# Patient Record
Sex: Male | Born: 1985 | Race: Asian | Hispanic: No | Smoking: Never smoker
Health system: Southern US, Community
[De-identification: ages and names within clinical notes are randomized; demographics above are authoritative.]

## PROBLEM LIST (undated history)

## (undated) HISTORY — PX: HERNIA REPAIR: SHX51

---

## 2016-12-11 DIAGNOSIS — Z0000 Encounter for general adult medical examination without abnormal findings: Secondary | ICD-10-CM | POA: Diagnosis not present

## 2016-12-18 DIAGNOSIS — Z0000 Encounter for general adult medical examination without abnormal findings: Secondary | ICD-10-CM | POA: Diagnosis not present

## 2017-07-31 DIAGNOSIS — R293 Abnormal posture: Secondary | ICD-10-CM | POA: Diagnosis not present

## 2017-07-31 DIAGNOSIS — M2560 Stiffness of unspecified joint, not elsewhere classified: Secondary | ICD-10-CM | POA: Diagnosis not present

## 2017-07-31 DIAGNOSIS — M545 Low back pain: Secondary | ICD-10-CM | POA: Diagnosis not present

## 2017-08-04 DIAGNOSIS — M545 Low back pain: Secondary | ICD-10-CM | POA: Diagnosis not present

## 2017-08-04 DIAGNOSIS — M2560 Stiffness of unspecified joint, not elsewhere classified: Secondary | ICD-10-CM | POA: Diagnosis not present

## 2017-08-04 DIAGNOSIS — R293 Abnormal posture: Secondary | ICD-10-CM | POA: Diagnosis not present

## 2017-08-11 DIAGNOSIS — R293 Abnormal posture: Secondary | ICD-10-CM | POA: Diagnosis not present

## 2017-08-11 DIAGNOSIS — M2560 Stiffness of unspecified joint, not elsewhere classified: Secondary | ICD-10-CM | POA: Diagnosis not present

## 2017-08-11 DIAGNOSIS — M545 Low back pain: Secondary | ICD-10-CM | POA: Diagnosis not present

## 2017-08-13 DIAGNOSIS — R293 Abnormal posture: Secondary | ICD-10-CM | POA: Diagnosis not present

## 2017-08-13 DIAGNOSIS — M545 Low back pain: Secondary | ICD-10-CM | POA: Diagnosis not present

## 2017-08-13 DIAGNOSIS — M2560 Stiffness of unspecified joint, not elsewhere classified: Secondary | ICD-10-CM | POA: Diagnosis not present

## 2017-08-14 DIAGNOSIS — M2560 Stiffness of unspecified joint, not elsewhere classified: Secondary | ICD-10-CM | POA: Diagnosis not present

## 2017-08-14 DIAGNOSIS — R293 Abnormal posture: Secondary | ICD-10-CM | POA: Diagnosis not present

## 2017-08-14 DIAGNOSIS — M545 Low back pain: Secondary | ICD-10-CM | POA: Diagnosis not present

## 2017-08-18 DIAGNOSIS — R293 Abnormal posture: Secondary | ICD-10-CM | POA: Diagnosis not present

## 2017-08-18 DIAGNOSIS — M2560 Stiffness of unspecified joint, not elsewhere classified: Secondary | ICD-10-CM | POA: Diagnosis not present

## 2017-08-18 DIAGNOSIS — M545 Low back pain: Secondary | ICD-10-CM | POA: Diagnosis not present

## 2017-08-20 DIAGNOSIS — M545 Low back pain: Secondary | ICD-10-CM | POA: Diagnosis not present

## 2017-08-20 DIAGNOSIS — R293 Abnormal posture: Secondary | ICD-10-CM | POA: Diagnosis not present

## 2017-08-20 DIAGNOSIS — M2560 Stiffness of unspecified joint, not elsewhere classified: Secondary | ICD-10-CM | POA: Diagnosis not present

## 2017-11-05 DIAGNOSIS — H6692 Otitis media, unspecified, left ear: Secondary | ICD-10-CM | POA: Diagnosis not present

## 2017-12-24 DIAGNOSIS — Z0000 Encounter for general adult medical examination without abnormal findings: Secondary | ICD-10-CM | POA: Diagnosis not present

## 2017-12-24 DIAGNOSIS — F17200 Nicotine dependence, unspecified, uncomplicated: Secondary | ICD-10-CM | POA: Diagnosis not present

## 2017-12-24 DIAGNOSIS — E669 Obesity, unspecified: Secondary | ICD-10-CM | POA: Diagnosis not present

## 2017-12-31 DIAGNOSIS — Z0000 Encounter for general adult medical examination without abnormal findings: Secondary | ICD-10-CM | POA: Diagnosis not present

## 2018-03-24 ENCOUNTER — Other Ambulatory Visit: Payer: Self-pay

## 2018-03-24 ENCOUNTER — Other Ambulatory Visit: Payer: Self-pay | Admitting: Internal Medicine

## 2018-03-24 DIAGNOSIS — M79661 Pain in right lower leg: Secondary | ICD-10-CM

## 2018-03-24 DIAGNOSIS — M7989 Other specified soft tissue disorders: Principal | ICD-10-CM

## 2018-03-25 ENCOUNTER — Encounter (HOSPITAL_COMMUNITY): Payer: Self-pay

## 2018-03-25 ENCOUNTER — Other Ambulatory Visit: Payer: Self-pay

## 2018-03-25 ENCOUNTER — Emergency Department (HOSPITAL_COMMUNITY): Payer: Managed Care, Other (non HMO)

## 2018-03-25 ENCOUNTER — Emergency Department (HOSPITAL_COMMUNITY)
Admission: EM | Admit: 2018-03-25 | Discharge: 2018-03-25 | Disposition: A | Discharge status: Home / Self Care | Payer: Managed Care, Other (non HMO) | Attending: Emergency Medicine | Admitting: Emergency Medicine

## 2018-03-25 DIAGNOSIS — Y939 Activity, unspecified: Secondary | ICD-10-CM | POA: Diagnosis not present

## 2018-03-25 DIAGNOSIS — Y999 Unspecified external cause status: Secondary | ICD-10-CM | POA: Diagnosis not present

## 2018-03-25 DIAGNOSIS — Y929 Unspecified place or not applicable: Secondary | ICD-10-CM | POA: Insufficient documentation

## 2018-03-25 DIAGNOSIS — S93491A Sprain of other ligament of right ankle, initial encounter: Secondary | ICD-10-CM | POA: Insufficient documentation

## 2018-03-25 DIAGNOSIS — S99911A Unspecified injury of right ankle, initial encounter: Secondary | ICD-10-CM | POA: Diagnosis present

## 2018-03-25 DIAGNOSIS — X58XXXA Exposure to other specified factors, initial encounter: Secondary | ICD-10-CM | POA: Diagnosis not present

## 2018-03-25 MED ORDER — LIDOCAINE-EPINEPHRINE (PF) 2 %-1:200000 IJ SOLN
20.0000 mL | Freq: Once | INTRAMUSCULAR | Status: AC
  Administered 2018-03-25: 20 mL
  Filled 2018-03-25: qty 20

## 2018-03-25 NOTE — ED Provider Notes (Signed)
[facility] EMERGENCY DEPARTMENT Provider Note   CSN: [id number] Arrival date & time: 03/25/18  1651    History   Chief Complaint Chief Complaint  Patient presents with  . Ankle Pain    HPI Calvin Hudson is a 33 y.o. male.     33 yo M with a chief complaint of right ankle pain.  Patient denies any injury.  Been going on for the past week.  He denies fevers or chills denies redness in the area.  He has been trying to not bear weight on it has had significant worsening edema and pain.  The history is provided by the patient.  Ankle Pain  Location:  Ankle Time since incident:  1 week Injury: no   Ankle location:  L ankle and R ankle Pain details:    Quality:  Aching   Radiates to:  Does not radiate   Severity:  Moderate   Onset quality:  Sudden   Duration:  1 week   Timing:  Constant   Progression:  Worsening Chronicity:  New Dislocation: no   Foreign body present:  No foreign bodies Prior injury to area:  No Relieved by:  Nothing Worsened by:  Bearing weight, adduction, abduction, activity, exercise, extension and flexion Ineffective treatments:  None tried Associated symptoms: no fever     History reviewed. No pertinent past medical history.  There are no active problems to display for this patient.   Past Surgical History:  Procedure Laterality Date  . HERNIA REPAIR          Home Medications    Prior to Admission medications   Not on File    Family History History reviewed. No pertinent family history.  Social History Social History   Tobacco Use  . Smoking status: Never Smoker  . Smokeless tobacco: Never Used  Substance Use Topics  . Alcohol use: Yes    Comment: social   . Drug use: Never     Allergies   Patient has no known allergies.   Review of Systems Review of Systems  Constitutional: Negative for chills and fever.  HENT: Negative for congestion and facial swelling.   Eyes: Negative for discharge and  visual disturbance.  Respiratory: Negative for shortness of breath.   Cardiovascular: Negative for chest pain and palpitations.  Gastrointestinal: Negative for abdominal pain, diarrhea and vomiting.  Musculoskeletal: Positive for arthralgias. Negative for myalgias.  Skin: Negative for color change and rash.  Neurological: Negative for tremors, syncope and headaches.  Psychiatric/Behavioral: Negative for confusion and dysphoric mood.     Physical Exam Updated Vital Signs BP (!) 151/92   Pulse 86   Temp 98.5 F (36.9 C) (Oral)   Resp 16   SpO2 96%   Physical Exam Vitals signs and nursing note reviewed.  Constitutional:      Appearance: He is well-developed.  HENT:     Head: Normocephalic and atraumatic.  Eyes:     Pupils: Pupils are equal, round, and reactive to light.  Neck:     Musculoskeletal: Normal range of motion and neck supple.     Vascular: No JVD.  Cardiovascular:     Rate and Rhythm: Normal rate and regular rhythm.     Heart sounds: No murmur. No friction rub. No gallop.   Pulmonary:     Effort: No respiratory distress.     Breath sounds: No wheezing.  Abdominal:     General: There is no distension.     Tenderness: There  is no guarding or rebound.  Musculoskeletal: Normal range of motion.        General: Swelling and tenderness present.     Comments: Tenderness and edema to the right ankle.  PMS intact distally. No noted erythema, able to range the ankle without difficulty.  Tenderness is focal to the ATF.  Skin:    Coloration: Skin is not pale.     Findings: No rash.  Neurological:     Mental Status: He is alert and oriented to person, place, and time.  Psychiatric:        Behavior: Behavior normal.      ED Treatments / Results  Labs (all labs ordered are listed, but only abnormal results are displayed) Labs Reviewed - No data to display  EKG None  Radiology Dg Ankle Complete Right  Result Date: 03/25/2018 CLINICAL DATA:  Pain and swelling  EXAM: RIGHT ANKLE - COMPLETE 3+ VIEW COMPARISON:  None. FINDINGS: Normal alignment no fracture. Diffuse soft tissue swelling around the ankle. No joint effusion. IMPRESSION: Soft tissue swelling.  Negative for fracture Electronically Signed   By: Marlan Palau M.D.   On: 03/25/2018 18:41    Procedures .Joint Aspiration/Arthrocentesis Date/Time: 03/25/2018 11:41 PM Performed by: Melene Plan, DO Authorized by: Melene Plan, DO   Consent:    Consent obtained:  Verbal   Consent given by:  Patient   Risks discussed:  Bleeding, infection, incomplete drainage and pain   Alternatives discussed:  No treatment, delayed treatment, alternative treatment and observation Location:    Location:  Ankle   Ankle:  R ankle Anesthesia (see MAR for exact dosages):    Anesthesia method:  Local infiltration   Local anesthetic:  Lidocaine 2% WITH epi Procedure details:    Preparation: Patient was prepped and draped in usual sterile fashion     Needle gauge: 21.   Ultrasound guidance: no     Approach:  Anterior   Aspirate amount:  0   Steroid injected: no     Specimen collected: no   Post-procedure details:    Dressing:  Adhesive bandage   Patient tolerance of procedure:  Tolerated well, no immediate complications Comments:     Dry tap, needle was obviously in the joint space was able to easily inject lidocaine without resistance into the joint with significant improvement of pain.    (including critical care time)  Medications Ordered in ED Medications  lidocaine-EPINEPHrine (XYLOCAINE W/EPI) 2 %-1:200000 (PF) injection 20 mL (20 mLs Infiltration Given by Other 03/25/18 2223)     Initial Impression / Assessment and Plan / ED Course  I have reviewed the triage vital signs and the nursing notes.  Pertinent labs & imaging results that were available during my care of the patient were reviewed by me and considered in my medical decision making (see chart for details).        33 yo M with a cc of R  ankle pain.  Worse after a week of conservative therapy.  Clinically it is unlikely to be septic arthritis this is not red painful with range of motion.  I discussed the risks and benefits of arthrocentesis with the patient said he would like to go ahead as it is nontraumatic and having worsening of his symptoms.  It was a dry tap and I was convincingly in the joint space.  This makes makes it much less likely to be septic arthritis.  A small amount of lidocaine was instilled into the joint with some improvement  of his pain.  We will have him continue conservative therapy follow-up with PCP.  Given orthopedic follow-up as needed.  11:47 PM:  I have discussed the diagnosis/risks/treatment options with the patient and believe the pt to be eligible for discharge home to follow-up with PCP, ortho. We also discussed returning to the ED immediately if new or worsening sx occur. We discussed the sx which are most concerning (e.g., sudden worsening pain, fever, inability to tolerate by mouth) that necessitate immediate return. Medications administered to the patient during their visit and any new prescriptions provided to the patient are listed below.  Medications given during this visit Medications  lidocaine-EPINEPHrine (XYLOCAINE W/EPI) 2 %-1:200000 (PF) injection 20 mL (20 mLs Infiltration Given by Other 03/25/18 2223)     The patient appears reasonably screen and/or stabilized for discharge and I doubt any other medical condition or other [facility] requiring further screening, evaluation, or treatment in the ED at this time prior to discharge.    Final Clinical Impressions(s) / ED Diagnoses   Final diagnoses:  Sprain of anterior talofibular ligament of right ankle, initial encounter    ED Discharge Orders    None       Melene Plan, DO 03/25/18 2347

## 2018-03-25 NOTE — Discharge Instructions (Signed)
Take 4 over the counter ibuprofen tablets 3 times a day or 2 over-the-counter naproxen tablets twice a day for pain. Also take tylenol 1000mg (2 extra strength) four times a day.    Return for redness, fever, drainage

## 2018-03-25 NOTE — ED Triage Notes (Signed)
Pt states some discomfort in his right ankle for several days. Pt states he has been non weight bearing. Ankle is markedly swollen, some redness noted. Pt denies injury.

## 2018-03-25 NOTE — ED Notes (Signed)
Patient verbalizes understanding of discharge instructions. Opportunity for questioning and answers were provided. 

## 2019-12-21 IMAGING — CR RIGHT ANKLE - COMPLETE 3+ VIEW
3 series · 3 of 3 positions shown · non-contrast
Comparison: None.

CLINICAL DATA: Pain and swelling

EXAM:
RIGHT ANKLE - COMPLETE 3+ VIEW

[ankle ap]
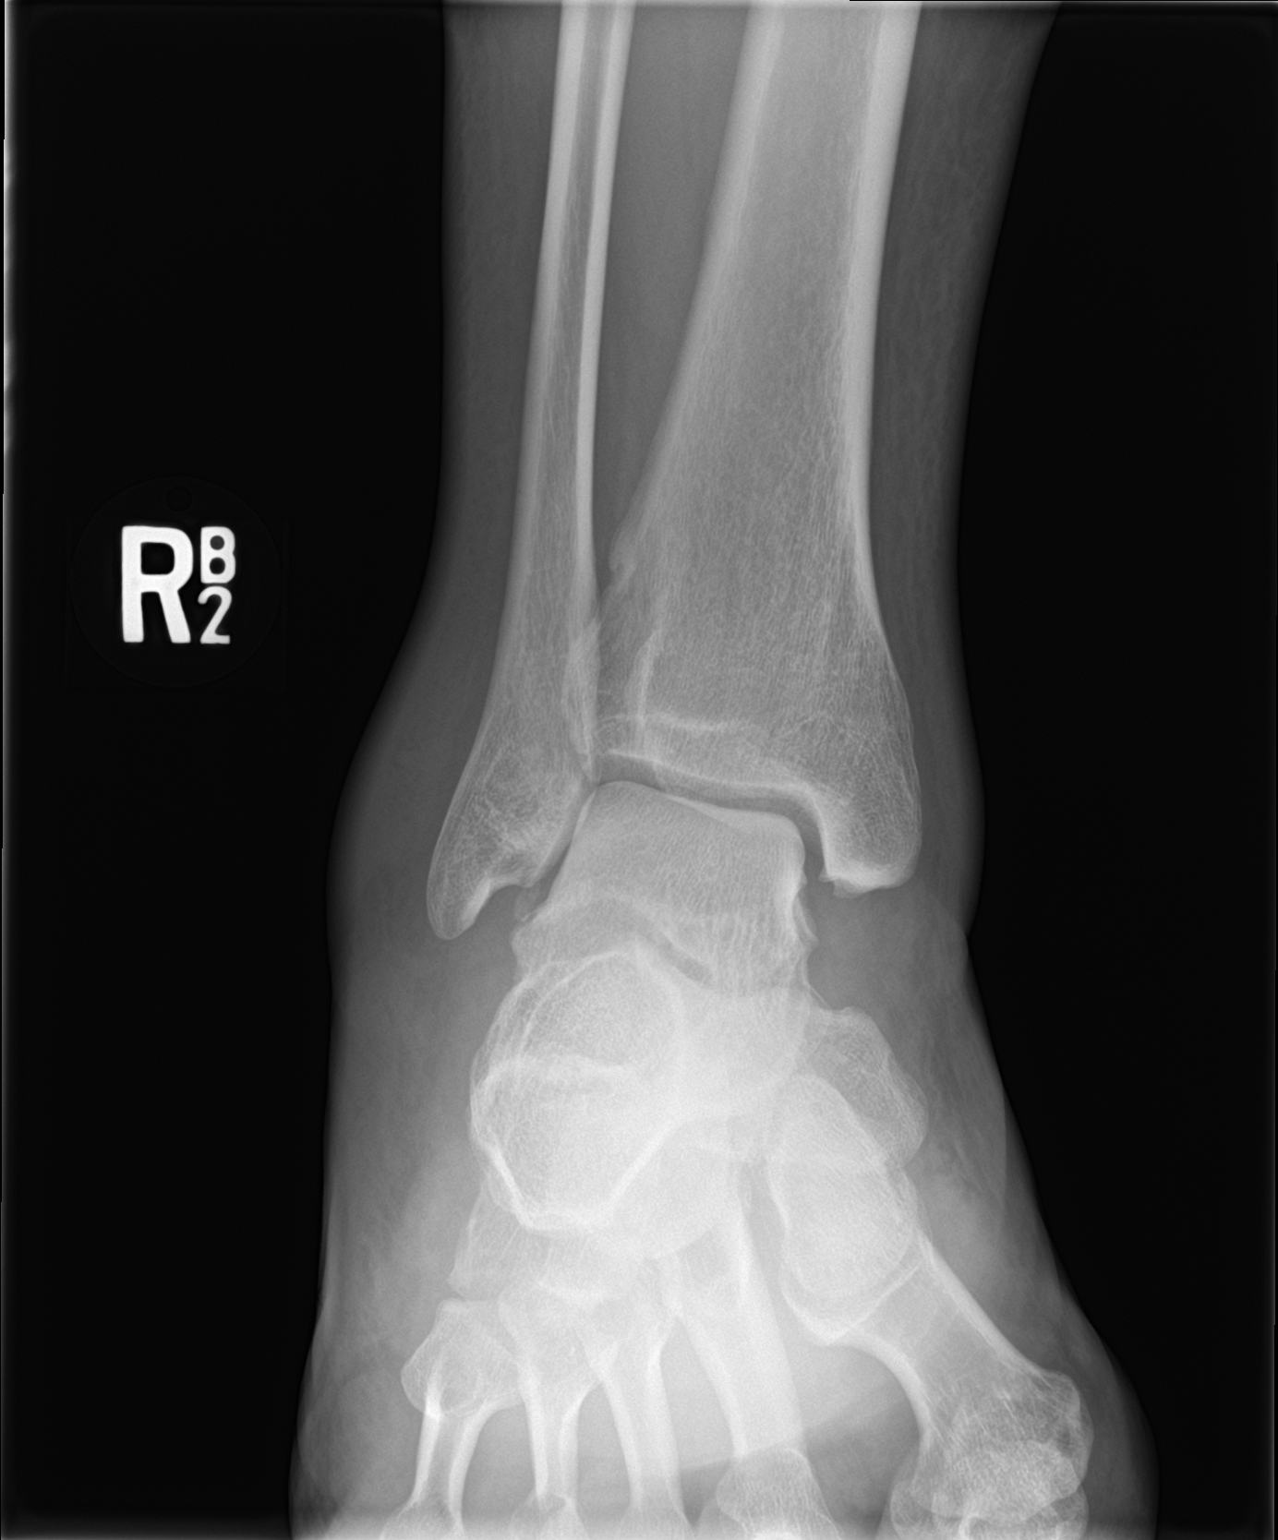

[ankle obl]
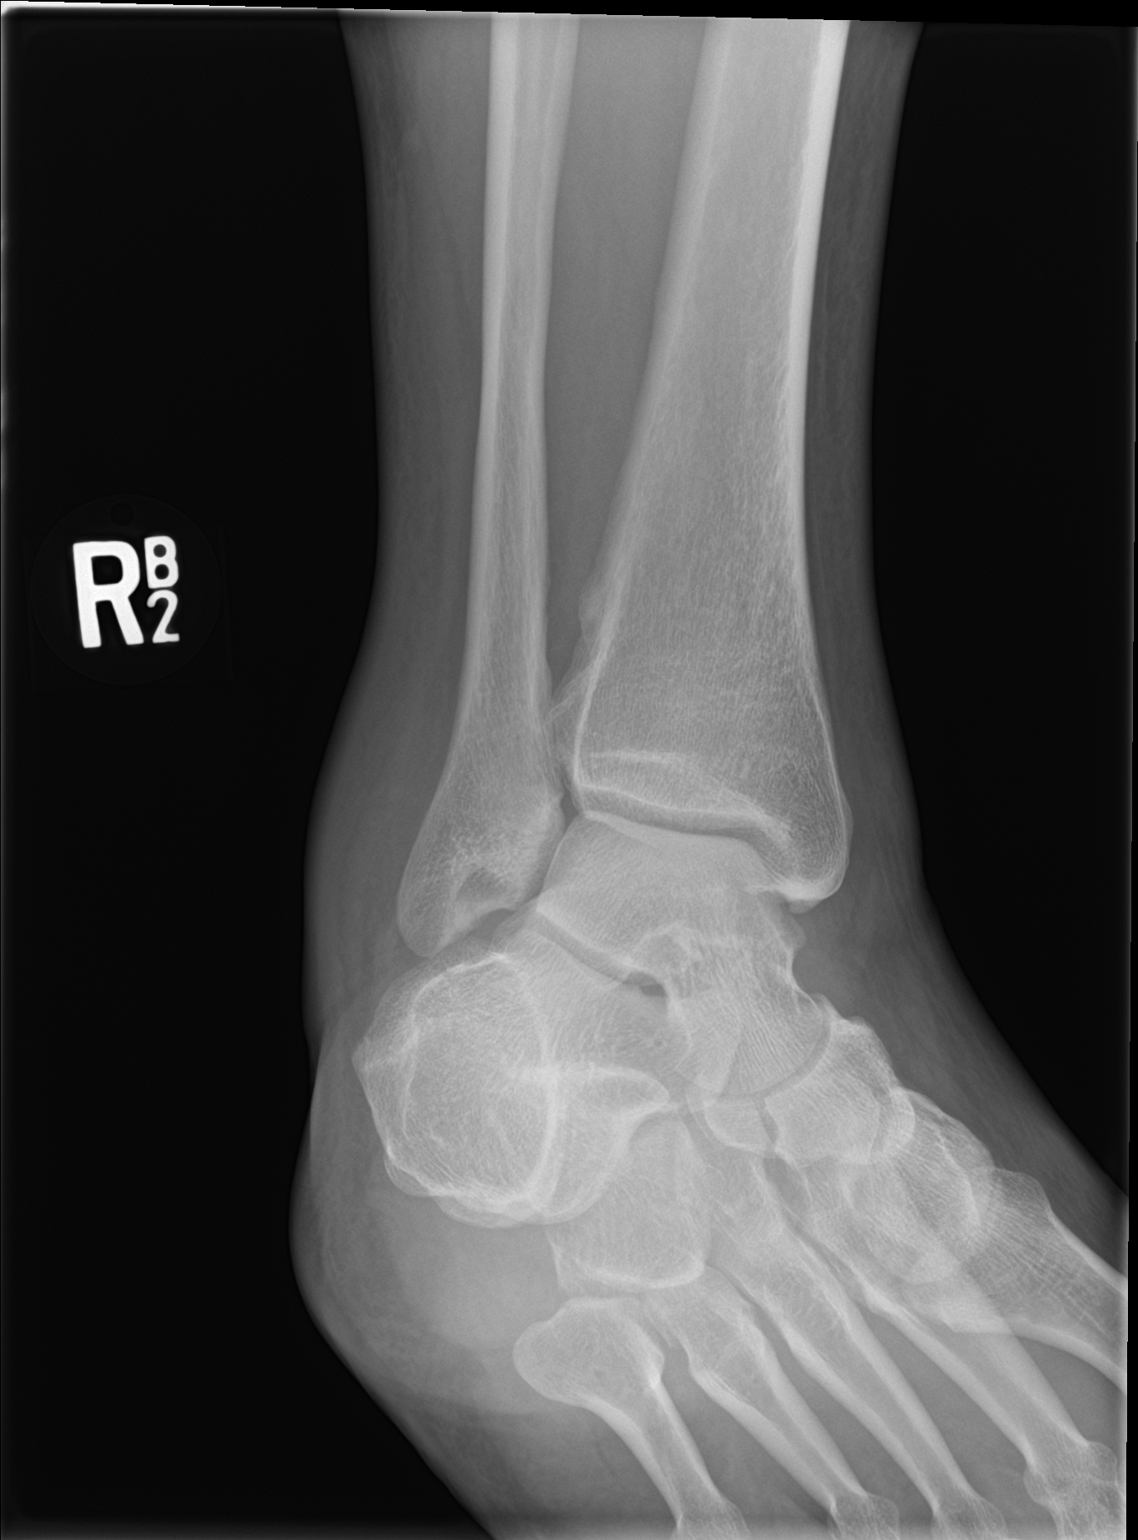

[ankle lat]
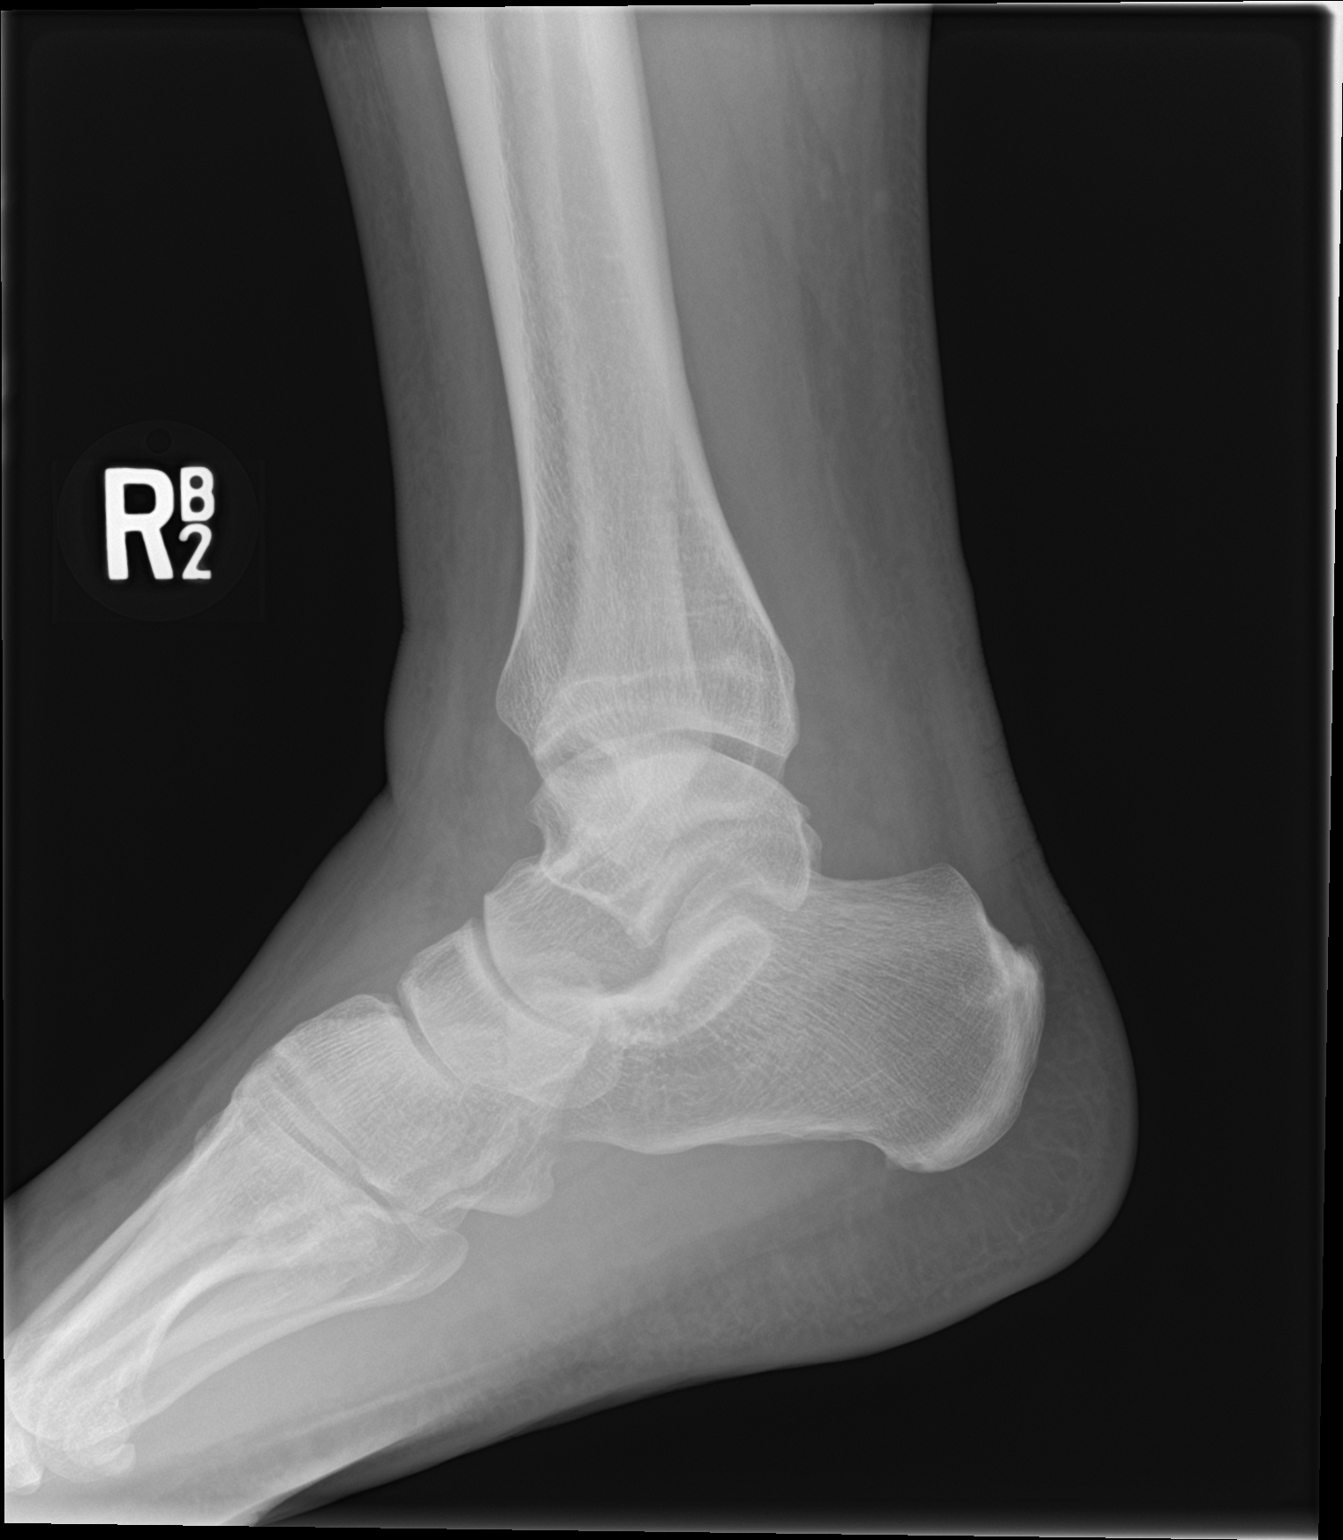

[3 of 3 positions shown; findings below may reference images not displayed]

FINDINGS: Normal alignment no fracture. Diffuse soft tissue swelling around
the ankle. No joint effusion.
IMPRESSION: Soft tissue swelling.  Negative for fracture
